# Patient Record
Sex: Female | Born: 1960 | Race: White | Hispanic: No | Marital: Married | Smoking: Never smoker
Health system: Southern US, Community
[De-identification: ages and names within clinical notes are randomized; demographics above are authoritative.]

## PROBLEM LIST (undated history)

## (undated) DIAGNOSIS — K859 Acute pancreatitis without necrosis or infection, unspecified: Secondary | ICD-10-CM

## (undated) DIAGNOSIS — K5790 Diverticulosis of intestine, part unspecified, without perforation or abscess without bleeding: Secondary | ICD-10-CM

## (undated) DIAGNOSIS — K219 Gastro-esophageal reflux disease without esophagitis: Secondary | ICD-10-CM

## (undated) DIAGNOSIS — N83209 Unspecified ovarian cyst, unspecified side: Secondary | ICD-10-CM

## (undated) DIAGNOSIS — G43909 Migraine, unspecified, not intractable, without status migrainosus: Secondary | ICD-10-CM

## (undated) DIAGNOSIS — K3184 Gastroparesis: Secondary | ICD-10-CM

## (undated) DIAGNOSIS — M1990 Unspecified osteoarthritis, unspecified site: Secondary | ICD-10-CM

## (undated) DIAGNOSIS — I499 Cardiac arrhythmia, unspecified: Secondary | ICD-10-CM

## (undated) DIAGNOSIS — R42 Dizziness and giddiness: Secondary | ICD-10-CM

## (undated) HISTORY — PX: MEDIAL COLLATERAL LIGAMENT REPAIR, KNEE: SHX2019

## (undated) HISTORY — PX: ANTERIOR CRUCIATE LIGAMENT REPAIR: SHX115

## (undated) HISTORY — PX: LUMBAR DISC SURGERY: SHX700

## (undated) HISTORY — PX: ACHILLES TENDON REPAIR: SUR1153

---

## 2015-10-27 DIAGNOSIS — K859 Acute pancreatitis without necrosis or infection, unspecified: Secondary | ICD-10-CM

## 2015-10-27 HISTORY — DX: Acute pancreatitis without necrosis or infection, unspecified: K85.90

## 2016-05-02 ENCOUNTER — Emergency Department: Payer: PRIVATE HEALTH INSURANCE

## 2016-05-02 ENCOUNTER — Emergency Department
Admission: EM | Admit: 2016-05-02 | Discharge: 2016-05-02 | Disposition: A | Discharge status: Home / Self Care | Payer: PRIVATE HEALTH INSURANCE | Attending: Emergency Medicine | Admitting: Emergency Medicine

## 2016-05-02 ENCOUNTER — Encounter: Payer: Self-pay | Admitting: Emergency Medicine

## 2016-05-02 DIAGNOSIS — R1031 Right lower quadrant pain: Secondary | ICD-10-CM | POA: Diagnosis present

## 2016-05-02 DIAGNOSIS — K5732 Diverticulitis of large intestine without perforation or abscess without bleeding: Secondary | ICD-10-CM | POA: Insufficient documentation

## 2016-05-02 HISTORY — DX: Gastroparesis: K31.84

## 2016-05-02 HISTORY — DX: Diverticulosis of intestine, part unspecified, without perforation or abscess without bleeding: K57.90

## 2016-05-02 HISTORY — DX: Acute pancreatitis without necrosis or infection, unspecified: K85.90

## 2016-05-02 HISTORY — DX: Unspecified ovarian cyst, unspecified side: N83.209

## 2016-05-02 LAB — COMPREHENSIVE METABOLIC PANEL
ALBUMIN: 4.2 g/dL (ref 3.5–5.0)
ALT: 22 U/L (ref 14–54)
ANION GAP: 8 (ref 5–15)
AST: 23 U/L (ref 15–41)
Alkaline Phosphatase: 77 U/L (ref 38–126)
BUN: 25 mg/dL — ABNORMAL HIGH (ref 6–20)
CALCIUM: 9 mg/dL (ref 8.9–10.3)
CHLORIDE: 106 mmol/L (ref 101–111)
CO2: 24 mmol/L (ref 22–32)
Creatinine, Ser: 0.88 mg/dL (ref 0.44–1.00)
GFR calc non Af Amer: >60 mL/min (ref >60)
GLUCOSE: 123 mg/dL — AB (ref 65–99)
Potassium: 3.7 mmol/L (ref 3.5–5.1)
SODIUM: 138 mmol/L (ref 135–145)
Total Bilirubin: 0.9 mg/dL (ref 0.3–1.2)
Total Protein: 8 g/dL (ref 6.5–8.1)

## 2016-05-02 LAB — URINALYSIS, COMPLETE (UACMP) WITH MICROSCOPIC
BACTERIA UA: NONE SEEN
Bilirubin Urine: NEGATIVE
Glucose, UA: NEGATIVE mg/dL
Hgb urine dipstick: NEGATIVE
KETONES UR: NEGATIVE mg/dL
Leukocytes, UA: NEGATIVE
Nitrite: NEGATIVE
Protein, ur: NEGATIVE mg/dL
RBC / HPF: NONE SEEN RBC/hpf (ref 0–5)
SQUAMOUS EPITHELIAL / LPF: NONE SEEN
Specific Gravity, Urine: 1.034 — ABNORMAL HIGH (ref 1.005–1.030)
WBC, UA: NONE SEEN WBC/hpf (ref 0–5)
pH: 5 (ref 5.0–8.0)

## 2016-05-02 LAB — MAGNESIUM: MAGNESIUM: 1.8 mg/dL (ref 1.7–2.4)

## 2016-05-02 LAB — CBC
HEMATOCRIT: 45.6 % (ref 35.0–47.0)
HEMOGLOBIN: 15.5 g/dL (ref 12.0–16.0)
MCH: 29.2 pg (ref 26.0–34.0)
MCHC: 34 g/dL (ref 32.0–36.0)
MCV: 85.6 fL (ref 80.0–100.0)
Platelets: 264 10*3/uL (ref 150–440)
RBC: 5.32 MIL/uL — AB (ref 3.80–5.20)
RDW: 13.8 % (ref 11.5–14.5)
WBC: 9.7 10*3/uL (ref 3.6–11.0)

## 2016-05-02 LAB — LIPASE, BLOOD: LIPASE: 19 U/L (ref 11–51)

## 2016-05-02 MED ORDER — IOPAMIDOL (ISOVUE-300) INJECTION 61%
30.0000 mL | Freq: Once | INTRAVENOUS | Status: AC | PRN
  Administered 2016-05-02: 30 mL via ORAL

## 2016-05-02 MED ORDER — IOPAMIDOL (ISOVUE-300) INJECTION 61%
100.0000 mL | Freq: Once | INTRAVENOUS | Status: AC | PRN
  Administered 2016-05-02: 100 mL via INTRAVENOUS

## 2016-05-02 MED ORDER — OXYCODONE-ACETAMINOPHEN 5-325 MG PO TABS
2.0000 | ORAL_TABLET | Freq: Once | ORAL | Status: AC
  Administered 2016-05-02: 2 via ORAL
  Filled 2016-05-02: qty 2

## 2016-05-02 MED ORDER — DOCUSATE SODIUM 100 MG PO CAPS: ORAL_CAPSULE | ORAL | 0 refills | Status: DC

## 2016-05-02 MED ORDER — MORPHINE SULFATE (PF) 4 MG/ML IV SOLN
4.0000 mg | Freq: Once | INTRAVENOUS | Status: AC
  Administered 2016-05-02: 4 mg via INTRAVENOUS
  Filled 2016-05-02: qty 1

## 2016-05-02 MED ORDER — ONDANSETRON HCL 4 MG/2ML IJ SOLN
4.0000 mg | INTRAMUSCULAR | Status: AC
  Administered 2016-05-02: 4 mg via INTRAVENOUS
  Filled 2016-05-02: qty 2

## 2016-05-02 MED ORDER — AMOXICILLIN-POT CLAVULANATE 875-125 MG PO TABS: 1.0000 | ORAL_TABLET | Freq: Two times a day (BID) | ORAL | 0 refills | Status: AC

## 2016-05-02 MED ORDER — OXYCODONE-ACETAMINOPHEN 5-325 MG PO TABS: 1.0000 | ORAL_TABLET | ORAL | 0 refills | Status: DC | PRN

## 2016-05-02 MED ORDER — AMOXICILLIN-POT CLAVULANATE 875-125 MG PO TABS
1.0000 | ORAL_TABLET | Freq: Once | ORAL | Status: AC
  Administered 2016-05-02: 1 via ORAL
  Filled 2016-05-02: qty 1

## 2016-05-02 MED ORDER — SODIUM CHLORIDE 0.9 % IV BOLUS (SEPSIS)
500.0000 mL | INTRAVENOUS | Status: AC
  Administered 2016-05-02: 500 mL via INTRAVENOUS

## 2016-05-02 MED ORDER — ONDANSETRON 4 MG PO TBDP: ORAL_TABLET | ORAL | 0 refills | Status: DC

## 2016-05-02 NOTE — ED Provider Notes (Signed)
[facility] Emergency Department Provider Note  ____________________________________________   First MD Initiated Contact with Patient 05/02/16 0140     (approximate)  I have reviewed the triage vital signs and the nursing notes.   HISTORY  Chief Complaint Fever and Abdominal Pain    HPI Susan Strong is a 56 y.o. 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  Presents with acute onset and gradually worsening abdominal pain that started this morninge states that it was mild at first but is now severe.  It is accompanied with persistent nausea and vomiting.  It feels similar to prior episodes of gastroparesis but she is concerned because it is located both in her epigastrium and in the right lower quadrant.  the pain in both locations is sharp and stabbing and made worse with movement.  nothing in particular makes it better.  She had some loose stool earlier tonight.  She denies shortness of breath, chest pain, dysuria.  she has had neither a cholecystectomy nor an appendectomy.  she states that she was extremely ill about 6 months ago when she lived in [place] with necrotizing pancreatitis.  They were not able to identify the cause.  She does not drink alcohol.  She does not have diabetes.  She has not recently started on any new medications.   Past Medical History:  Diagnosis Date  . Diverticulosis   . Gastroparesis   . Ovarian cyst   . Pancreatitis     There are no active problems to display for this patient.   Past Surgical History:  Procedure Laterality Date  . ACHILLES TENDON REPAIR    . ANTERIOR CRUCIATE LIGAMENT REPAIR Right   . LUMBAR DISC SURGERY    . MEDIAL COLLATERAL LIGAMENT REPAIR, KNEE Left     Prior to Admission medications   Medication Sig Start Date End Date Taking? Authorizing Provider    amoxicillin-clavulanate (AUGMENTIN) 875-125 MG tablet Take 1 tablet by mouth every 12 (twelve) hours. 05/02/16 05/12/16  Hinda Kehr, MD  docusate sodium (COLACE) 100 MG capsule Take 1 tablet once or twice daily as needed for constipation while taking narcotic pain medicine 05/02/16   Hinda Kehr, MD  ondansetron (ZOFRAN ODT) 4 MG disintegrating tablet Allow 1-2 tablets to dissolve in your mouth every 8 hours as needed for nausea/vomiting 05/02/16   Hinda Kehr, MD  oxyCODONE-acetaminophen (ROXICET) 5-325 MG tablet Take 1-2 tablets by mouth every 4 (four) hours as needed for severe pain. 05/02/16   Hinda Kehr, MD    Allergies Patient has no known allergies.  History reviewed. No pertinent family history.  Social History Social History  Substance Use Topics  . Smoking status: Never Smoker  . Smokeless tobacco: Never Used  . Alcohol use No    Review of Systems Constitutional: No fever/chills Eyes: No visual changes. ENT: No sore throat. Cardiovascular: Denies chest pain. Respiratory: Denies shortness of breath. Gastrointestinal: Epigastric and right lower quadrant pain with nausea and vomiting starting earlier today Genitourinary: Negative for dysuria. Musculoskeletal: Negative for back pain. Skin: Negative for rash. Neurological: Negative for headaches, focal weakness or numbness.  10-point ROS otherwise negative.  ____________________________________________   PHYSICAL EXAM:  VITAL SIGNS: ED Triage Vitals [05/02/16 0134]  Enc Vitals Group     BP 136/88     Pulse Rate (!) 107     Resp 18     Temp 98.9 F (37.2  C)     Temp Source Oral     SpO2 94 %     Weight 160 lb (72.6 kg)     Height 5\' 6"  (1.676 m)     Head Circumference      Peak Flow      Pain Score 6     Pain Loc      Pain Edu?      Excl. in [place]?     Constitutional: Alert and oriented. nontoxic and generally well-appearing but does appear uncomfortable Eyes: Conjunctivae are normal. PERRL. EOMI. Head:  Atraumatic. Nose: No congestion/rhinnorhea. Mouth/Throat: Mucous membranes are moist. Neck: No stridor.  No meningeal signs.   Cardiovascular: Normal rate, regular rhythm. Good peripheral circulation. Grossly normal heart sounds. Respiratory: Normal respiratory effort.  No retractions. Lungs CTAB. Gastrointestinal: Soft with moderate tenderness to palpation of the epigastrium and RLQ with mild rebound.  No guarding.  Negative Murphy's sign. Musculoskeletal: No lower extremity tenderness nor edema. No gross deformities of extremities. Neurologic:  Normal speech and language. No gross focal neurologic deficits are appreciated.  Skin:  Skin is warm, dry and intact. No rash noted. Psychiatric: Mood and affect are anxious. Speech and behavior are normal.  ____________________________________________   LABS (all labs ordered are listed, but only abnormal results are displayed)  Labs Reviewed  COMPREHENSIVE METABOLIC PANEL - Abnormal; Notable for the following:       Result Value   Glucose, Bld 123 (*)    BUN 25 (*)    All other components within normal limits  CBC - Abnormal; Notable for the following:    RBC 5.32 (*)    All other components within normal limits  URINALYSIS, COMPLETE (UACMP) WITH MICROSCOPIC - Abnormal; Notable for the following:    Color, Urine STRAW (*)    APPearance CLEAR (*)    Specific Gravity, Urine 1.034 (*)    All other components within normal limits  LIPASE, BLOOD  MAGNESIUM   ____________________________________________  EKG  None - EKG not ordered by ED physician ____________________________________________  RADIOLOGY   Ct Abdomen Pelvis W Contrast  Result Date: 05/02/2016 CLINICAL DATA:  Low-grade fevers with right lower quadrant pain. History of pancreatitis, last hospitalized in October. EXAM: CT ABDOMEN AND PELVIS WITH CONTRAST TECHNIQUE: Multidetector CT imaging of the abdomen and pelvis was performed using the standard protocol following bolus  administration of intravenous contrast. CONTRAST:  120mL ISOVUE-300 IOPAMIDOL (ISOVUE-300) INJECTION 61% COMPARISON:  None. FINDINGS: Lower chest: Normal size cardiac chambers without pericardial effusion. Atelectasis and/or scarring at the right lung base. No dominant mass, effusion or pneumothorax. Hepatobiliary: Variable sized circumscribed hypodensities consistent with cysts or hemangiomata of the liver ranging in size from 2 mm through 22 mm. Some are too small to further characterize. Most demonstrate Hounsfield units suggestive of simple cysts. The gallbladder is physiologically distended without calculus. No wall thickening nor pericholecystic fluid. No biliary dilatation is identified. Pancreas: Hypodensities noted along the body and tail of the pancreas are noted which may reflect sequela of chronic pancreatitis and pseudocysts. Side branch IPMN of the pancreas are not entirely excluded. The largest area of hypodensity measures 3.7 cm in the body. Smaller hypodensities along the pancreatic tail measuring to 22 mm are noted as well as a tubular hypodensity along the caudal surface the pancreas on the coronal reformats. No ductal dilatation. Spleen: Normal in size without focal abnormality. Adrenals/Urinary Tract: Normal bilateral adrenal glands. Small parapelvic renal cysts are noted. Small hypodensities are noted within the mid  to lower pole of the right kidney consistent with cysts, the largest approximately 7 mm. These are too small to further characterize. Minimal cortical scarring is seen posteriorly involving the interpolar right kidney. Stomach/Bowel: The stomach is distended with oral contrast. There is normal small bowel rotation without evidence of obstruction or definite inflammation. The appendix is normal. Segmental thickening along the proximal sigmoid colon with pericolonic inflammation is identified. Surrounding diverticulosis is noted. Findings likely represent acute diverticulitis however  followup to assure resolution is recommended to exclude neoplasm after appropriate therapy. Vascular/Lymphatic: No significant vascular findings are present. No enlarged abdominal or pelvic lymph nodes. Reproductive: Uterus and bilateral adnexa are unremarkable. Other: No abdominal wall hernia or abnormality. Trace free fluid in the pelvis. Musculoskeletal: Degenerative disc or scar high across cough or small right central disc-osteophyte complex. IMPRESSION: 1. Abnormal thickening with pericolonic inflammation involving a short segment of proximal sigmoid colon. Surrounding areas of diverticulosis are noted. Findings likely represent acute diverticulitis with pericolonic inflammation and trace adjacent free fluid. No abscess is identified. No free air is noted. Follow-up is recommended to assure resolution and to exclude neoplasm. 2. The variable sized hepatic hypodensities ranging in size from 2 mm to 22 mm statistically consistent with cysts or hemangiomata. 3. Cystic hypodensities associated with the pancreas or likely representing areas of pseudocyst formation given history of prior pancreatitis. Side-branch IPMN are possibilities. These too can be reassessed up on follow-up or evaluated with dedicated pancreas CT or MRI. 4. Small subcentimeter hypodensities associated with the mid to lower right kidney also likely to represent small cyst but too small to further characterize. Electronically Signed   By: Ashley Royalty M.D.   On: 05/02/2016 03:11    ____________________________________________   PROCEDURES  Critical Care performed: No   Procedure(s) performed:   Procedures   ____________________________________________   INITIAL IMPRESSION / ASSESSMENT AND PLAN / ED COURSE  Pertinent labs & imaging results that were available during my care of the patient were reviewed by me and considered in my medical decision making (see chart for details).  the patient has a complicated history of  idiopathic pancreatitis as well as recurrent episodes of gastroparesis. her tenderness to the epigastrium and right lower quadrant could suggest either pancreatitis, gallbladder disease,appendicitis, or gastroparesis.  We will evaluate broadly including CT scan.  Analgesia and antiemetics.  Small fluid bolus.  Patient agrees with the plan.   Clinical Course as of May 03 346  Sat May 02, 2016  [id number] I reviewed the patient's prescription history over the last 12 months in the multi-state controlled substances database(s) that includes [place], [place], [place], [place], [place], [place], [place], [place], [place], [place], [place], [place], [place], and [place].  The patient has filled no controlled substances during that time.   [CF]  [phone] The patient has a reassuring lab workup.  Her CT scan report indicates uncomplicated diverticulitis.  I discussed this with her and she is comfortable with the plan for outpatient treatment.  She has had bilateral Achilles tendon surgeries and would prefer to avoid Cipro, so I will prescribe Augmentin as well as Zofran and Percocet.  I gave my usual and customary return precautions.     [CF]  [id number] I also provided follow up contact information for the patient navigator to establish a PCP (although she plans to see Dr. Doy Mince), gastroenterology (Dr. Vicente Males), and surgery PRN (Dr. Hampton Abbot).  [CF]    Clinical Course User Index [CF] Hinda Kehr, MD  ____________________________________________  FINAL CLINICAL IMPRESSION(S) / ED DIAGNOSES  Final diagnoses:  Diverticulitis of large intestine without perforation or abscess, unspecified bleeding status     MEDICATIONS GIVEN DURING THIS VISIT:  Medications  amoxicillin-clavulanate (AUGMENTIN) 875-125 MG per tablet 1 tablet   oxyCODONE-acetaminophen (PERCOCET/ROXICET) 5-325 MG per tablet 2 tablet   morphine 4 MG/ML injection 4 mg (4 mg Intravenous Given 05/02/16 0157)    ondansetron (ZOFRAN) injection 4 mg (4 mg Intravenous Given 05/02/16 0157)  sodium chloride 0.9 % bolus 500 mL (0 mLs Intravenous Stopped 05/02/16 0230)  iopamidol (ISOVUE-300) 61 % injection 30 mL (30 mLs Oral Contrast Given 05/02/16 0207)  iopamidol (ISOVUE-300) 61 % injection 100 mL (100 mLs Intravenous Contrast Given 05/02/16 0234)     NEW OUTPATIENT MEDICATIONS STARTED DURING THIS VISIT:  New Prescriptions   AMOXICILLIN-CLAVULANATE (AUGMENTIN) 875-125 MG TABLET    Take 1 tablet by mouth every 12 (twelve) hours.   DOCUSATE SODIUM (COLACE) 100 MG CAPSULE    Take 1 tablet once or twice daily as needed for constipation while taking narcotic pain medicine   ONDANSETRON (ZOFRAN ODT) 4 MG DISINTEGRATING TABLET    Allow 1-2 tablets to dissolve in your mouth every 8 hours as needed for nausea/vomiting   OXYCODONE-ACETAMINOPHEN (ROXICET) 5-325 MG TABLET    Take 1-2 tablets by mouth every 4 (four) hours as needed for severe pain.    Modified Medications   No medications on file    Discontinued Medications   No medications on file     Note:  This document was prepared using Dragon voice recognition software and may include unintentional dictation errors.    Hinda Kehr, MD 05/02/16 [phone]

## 2016-05-02 NOTE — ED Notes (Signed)
Pt returned from CT °

## 2016-05-02 NOTE — ED Notes (Signed)

## 2016-05-02 NOTE — ED Triage Notes (Signed)
Pt ambulatory to triage in NAD, reports RLQ and low grade fever at home, report hx of pancreatitis, last hospitalized for it back in October.  Reports nausea w/o vomiting

## 2016-05-02 NOTE — ED Notes (Signed)
Pt finished contrast; CT called; Pam will scan once CMP resulted

## 2016-05-02 NOTE — ED Notes (Signed)
Pt going to CT

## 2016-05-02 NOTE — Discharge Instructions (Signed)
We believe your symptoms are caused by diverticulitis.  Most of the time this condition (please read through the included information) can be cured with outpatient antibiotics.  Please take the full course of prescribed medication(s) and follow up with the doctors recommended above.  Return to the ED if your abdominal pain worsens or fails to improve, you develop bloody vomiting, bloody diarrhea, you are unable to tolerate fluids due to vomiting, fever greater than 101, or other symptoms that concern you.  Take Percocet as prescribed for severe pain. Do not drink alcohol, drive or participate in any other potentially dangerous activities while taking this medication as it may make you sleepy. Do not take this medication with any other sedating medications, either prescription or over-the-counter. If you were prescribed Percocet or Vicodin, do not take these with acetaminophen (Tylenol) as it is already contained within these medications.   This medication is an opiate (or narcotic) pain medication and can be habit forming.  Use it as little as possible to achieve adequate pain control.  Do not use or use it with extreme caution if you have a history of opiate abuse or dependence.  If you are on a pain contract with your primary care doctor or a pain specialist, be sure to let them know you were prescribed this medication today from the [facility].  This medication is intended for your use only - do not give any to anyone else and keep it in a secure place where nobody else, especially children, have access to it.  It will also cause or worsen constipation, so you may want to consider taking an over-the-counter stool softener while you are taking this medication.  

## 2016-05-02 NOTE — ED Notes (Signed)
Pt drinking second bottle of contrast 

## 2016-05-19 DIAGNOSIS — Z7189 Other specified counseling: Secondary | ICD-10-CM | POA: Insufficient documentation

## 2016-05-19 DIAGNOSIS — K5730 Diverticulosis of large intestine without perforation or abscess without bleeding: Secondary | ICD-10-CM | POA: Insufficient documentation

## 2016-05-19 DIAGNOSIS — Z7185 Encounter for immunization safety counseling: Secondary | ICD-10-CM | POA: Insufficient documentation

## 2016-05-20 DIAGNOSIS — E7800 Pure hypercholesterolemia, unspecified: Secondary | ICD-10-CM | POA: Insufficient documentation

## 2016-06-09 ENCOUNTER — Encounter: Payer: Self-pay | Admitting: Gastroenterology

## 2016-06-09 ENCOUNTER — Ambulatory Visit (INDEPENDENT_AMBULATORY_CARE_PROVIDER_SITE_OTHER): Payer: PRIVATE HEALTH INSURANCE | Admitting: Gastroenterology

## 2016-06-09 VITALS — BP 111/75 | HR 60 | Temp 97.4°F | Resp 16 | Ht 66.0 in | Wt 161.6 lb

## 2016-06-09 DIAGNOSIS — K5732 Diverticulitis of large intestine without perforation or abscess without bleeding: Secondary | ICD-10-CM | POA: Diagnosis not present

## 2016-06-09 DIAGNOSIS — K859 Acute pancreatitis without necrosis or infection, unspecified: Secondary | ICD-10-CM | POA: Diagnosis not present

## 2016-06-09 NOTE — Progress Notes (Signed)
Gastroenterology Consultation  Referring Provider:     Dr Karma Greaser Primary Care Physician:  Patient, No Pcp Per Primary Gastroenterologist:  Dr. Jonathon Bellows  Reason for Consultation:     Diverticulitis and pancreatitis.         HPI:   Susan Strong is a 56 y.o. y/o female female referred for consultation & management  by Dr Karma Greaser  She has been referred for pancreatitis and diverticulitis. She was seen at the ER on 05/02/16 and per ED note has  ahistory of necrotizing pancreatitis in 01/2013  at [place] of unknown etiology   , gastroparesis , she presented to the ER with abdominal pain . She had a CT scan in the ER which showed pericolonic inflammaiton , acute diverticulitis , heptic hypodensities which could be cysts or hemangiomata. Cystic hypodensities ?pseudocysts , possible side branch IPMN. Subcenter hypodensities of the kidney. No gall bladder calculus. Recent labs including CMP,CBC,Lipase were normal.    She has brought in records from [place] and a MRI -severe acute pancreatitis (necrotizing pancreatitis fat necrosis) , no gall stones or common bile duct stones. No mention of pancreatic divisum in report. USG abdomen - normal . She was in the ICU for 18 days, resolved on its own . She was told she had a pseudocyst . I reviewed her labs during the admission , right after her pancreatitis her transaminases were normal , after she started TPM they were a bit elevated. An ultrasound at that time suggests she had some sludge . IGG4 levels were normal.  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 She says there is no etiology been found for her gastroparesis  She says she has been to the hospital recently for abdominal pain   Abdominal pain: Onset: since pancreatitis towards the end  Site :all over the abdomen - when she eats the "wrong thing"overall it is improving , trying to lose some weight  Radiation: sometimes goes to the back  [place] of  pain: like indigestion, associated with burping  Aggravating factors: eating  Relieving factors :not eating  Weight loss: stable  NSAID use: no  PPI use :only rarely tigamet  Gall bladder surgery: intact  Frequency of bowel movements: everyday , when she had diverticulitis had 8 BM's a day  Change in bowel movements: none presently  Relief with bowel movements: no  Gas/Bloating/Abdominal distension: no    She had 2 episodes of diverticulitis twice, once a month back and again a week back and is completing a second course.   Grandmother and parents had colon polyps, she too has had colon polyps, her last colonoscopy was 5 years back .    Not on any medications, herbal supplements.    Past Medical History:  Diagnosis Date  . Diverticulosis   . Gastroparesis   . Ovarian cyst   . Pancreatitis     Past Surgical History:  Procedure Laterality Date  . ACHILLES TENDON REPAIR    . ANTERIOR CRUCIATE LIGAMENT REPAIR Right   . LUMBAR DISC SURGERY    . MEDIAL COLLATERAL LIGAMENT REPAIR, KNEE Left     Prior to Admission medications   Medication Sig Start Date End Date Taking? Authorizing Provider  docusate sodium (COLACE) 100 MG capsule Take 1 tablet once or twice daily as needed for constipation while taking narcotic pain medicine Patient not taking: Reported on 06/09/2016 05/02/16   Hinda Kehr, MD  ondansetron (ZOFRAN ODT) 4 MG disintegrating tablet  Allow 1-2 tablets to dissolve in your mouth every 8 hours as needed for nausea/vomiting Patient not taking: Reported on 06/09/2016 05/02/16   Hinda Kehr, MD  oxyCODONE-acetaminophen (ROXICET) 5-325 MG tablet Take 1-2 tablets by mouth every 4 (four) hours as needed for severe pain. Patient not taking: Reported on 06/09/2016 05/02/16   Hinda Kehr, MD    No family history on file.   Social History  Substance Use Topics  . Smoking status: Never Smoker  . Smokeless tobacco: Never Used  . Alcohol use No    Allergies as of 06/09/2016    . (No Known Allergies)    Review of Systems:    All systems reviewed and negative except where noted in HPI.   Physical Exam:  BP 111/75   Pulse 60   Temp 97.4 F (36.3 C) (Oral)   Resp 16   Ht 5\' 6"  (1.676 m)   Wt 161 lb 9.6 oz (73.3 kg)   BMI 26.08 kg/m  No LMP recorded. Patient is perimenopausal. Psych:  Alert and cooperative. Normal mood and affect. General:   Alert,  Well-developed, well-nourished, pleasant and cooperative in NAD Head:  Normocephalic and atraumatic. Eyes:  Sclera clear, no icterus.   Conjunctiva pink. Ears:  Normal auditory acuity. Nose:  No deformity, discharge, or lesions. Mouth:  No deformity or lesions,oropharynx pink & moist. Neck:  Supple; no masses or thyromegaly. Lungs:  Respirations even and unlabored.  Clear throughout to auscultation.   No wheezes, crackles, or rhonchi. No acute distress. Heart:  Regular rate and rhythm; no murmurs, clicks, rubs, or gallops. Abdomen:  Normal bowel sounds.  No bruits.  Soft, non-tender and non-distended without masses, hepatosplenomegaly or hernias noted.  No guarding or rebound tenderness.    Msk:  Symmetrical without gross deformities. Good, equal movement & strength bilaterally. Neurologic:  Alert and oriented x3;  grossly normal neurologically. Skin:  Intact without significant lesions or rashes. No jaundice. Lymph Nodes:  No significant cervical adenopathy. Psych:  Alert and cooperative. Normal mood and affect.  Imaging Studies: No results found.  Assessment and Plan:   Susan Strong is a 56 y.o. y/o female has been referred for recent episode of diverticulitis . 2 episodes recently , H/o colon polyps  Plan  1. MRI liver mass protocol and MRCP to evaluate lesions in the liver, pancreas, r/o IPMN and any other lesions such as divisum that could have triggered the severe episode of pancreatitis. Schedule in 4-5 weeks not sooner as we need to wait for the diverticulitis to resolve in terms of the  inflammation   2. Colonoscopy after imaging once colon inflammation resolution has been confirmed.    Follow up in 3 months   Dr Jonathon Bellows MD

## 2016-06-10 ENCOUNTER — Other Ambulatory Visit: Payer: Self-pay

## 2016-06-10 DIAGNOSIS — K5730 Diverticulosis of large intestine without perforation or abscess without bleeding: Secondary | ICD-10-CM

## 2016-06-24 ENCOUNTER — Ambulatory Visit
Admission: RE | Admit: 2016-06-24 | Discharge: 2016-06-24 | Disposition: A | Discharge status: Home / Self Care | Payer: PRIVATE HEALTH INSURANCE | Source: Ambulatory Visit | Attending: Gastroenterology | Admitting: Gastroenterology

## 2016-06-24 ENCOUNTER — Other Ambulatory Visit: Payer: Self-pay | Admitting: Gastroenterology

## 2016-06-24 DIAGNOSIS — K5730 Diverticulosis of large intestine without perforation or abscess without bleeding: Secondary | ICD-10-CM | POA: Diagnosis present

## 2016-06-24 DIAGNOSIS — K7689 Other specified diseases of liver: Secondary | ICD-10-CM | POA: Insufficient documentation

## 2016-06-24 DIAGNOSIS — N281 Cyst of kidney, acquired: Secondary | ICD-10-CM | POA: Insufficient documentation

## 2016-06-24 MED ORDER — GADOBENATE DIMEGLUMINE 529 MG/ML IV SOLN
15.0000 mL | Freq: Once | INTRAVENOUS | Status: AC | PRN
  Administered 2016-06-24: 15 mL via INTRAVENOUS

## 2016-06-26 ENCOUNTER — Telehealth: Payer: Self-pay | Admitting: Gastroenterology

## 2016-06-26 NOTE — Telephone Encounter (Signed)
results

## 2016-06-29 ENCOUNTER — Telehealth: Payer: Self-pay

## 2016-06-29 NOTE — Telephone Encounter (Signed)
LVM for patient advising that results were in from scans but we're unable to relay any information due to Dr. Vicente Males not having read the results yet.  Advised a callback on Thursday with the information.

## 2016-07-03 ENCOUNTER — Telehealth: Payer: Self-pay | Admitting: Gastroenterology

## 2016-07-03 NOTE — Telephone Encounter (Signed)
Patient called wanting her results of her MRI.

## 2016-07-05 NOTE — Telephone Encounter (Signed)
seee MRI report and my note

## 2016-07-06 ENCOUNTER — Other Ambulatory Visit: Payer: Self-pay

## 2016-07-06 DIAGNOSIS — Z1211 Encounter for screening for malignant neoplasm of colon: Secondary | ICD-10-CM

## 2016-07-06 DIAGNOSIS — Z1212 Encounter for screening for malignant neoplasm of rectum: Principal | ICD-10-CM

## 2016-07-06 NOTE — Telephone Encounter (Signed)
  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 Ok to schedule colonoscopy as per my last office note  Gastroenterology Pre-Procedure Review  Request Date: 6/21 Requesting Physician: Dr. Allen Norris  PATIENT REVIEW QUESTIONS: The patient responded to the following health history questions as indicated:    1. Are you having any GI issues? GERD 2. Do you have a personal history of Polyps? yes (removed) 3. Do you have a family history of Colon Cancer or Polyps? yes (maternal gm: deceased via colon cancer) 4. Diabetes Mellitus? no 5. Joint replacements in the past 12 months?no 6. Major health problems in the past 3 months?yes (pancreatitis) 7. Any artificial heart valves, MVP, or defibrillator?no    MEDICATIONS & ALLERGIES:    Patient reports the following regarding taking any anticoagulation/antiplatelet therapy:   Plavix, Coumadin, Eliquis, Xarelto, Lovenox, Pradaxa, Brilinta, or Effient? no Aspirin? no  Patient confirms/reports the following medications:  Current Outpatient Prescriptions  Medication Sig Dispense Refill  . docusate sodium (COLACE) 100 MG capsule Take 1 tablet once or twice daily as needed for constipation while taking narcotic pain medicine (Patient not taking: Reported on 06/09/2016) 30 capsule 0  . ondansetron (ZOFRAN ODT) 4 MG disintegrating tablet Allow 1-2 tablets to dissolve in your mouth every 8 hours as needed for nausea/vomiting (Patient not taking: Reported on 06/09/2016) 30 tablet 0  . oxyCODONE-acetaminophen (ROXICET) 5-325 MG tablet Take 1-2 tablets by mouth every 4 (four) hours as needed for severe pain. (Patient not taking: Reported on 06/09/2016) 30 tablet 0   No current facility-administered medications for this visit.     Patient confirms/reports the following allergies:  No Known Allergies  No orders of the defined types were placed in this encounter.   AUTHORIZATION INFORMATION Primary Insurance: 1D#: Group #:  Secondary Insurance: 1D#: Group  #:  SCHEDULE INFORMATION: Date: 6/21 Time: Location: [place]

## 2016-07-07 ENCOUNTER — Telehealth: Payer: Self-pay | Admitting: Gastroenterology

## 2016-07-07 NOTE — Telephone Encounter (Signed)
07/07/16 Spoke with Rise Paganini at [facility] for Colonoscopy [phone] / Z12.11 has been approved Ref #: [id number].

## 2016-07-08 ENCOUNTER — Encounter: Payer: Self-pay | Admitting: *Deleted

## 2016-07-15 NOTE — Discharge Instructions (Signed)
General Anesthesia, Adult, Care After °These instructions provide you with information about caring for yourself after your procedure. Your health care provider may also give you more specific instructions. Your treatment has been planned according to current medical practices, but problems sometimes occur. Call your health care provider if you have any problems or questions after your procedure. °What can I expect after the procedure? °After the procedure, it is common to have: °· Vomiting. °· A sore throat. °· Mental slowness. ° °It is common to feel: °· Nauseous. °· Cold or shivery. °· Sleepy. °· Tired. °· Sore or achy, even in parts of your body where you did not have surgery. ° °Follow these instructions at home: °For at least 24 hours after the procedure: °· Do not: °? Participate in activities where you could fall or become injured. °? Drive. °? Use heavy machinery. °? Drink alcohol. °? Take sleeping pills or medicines that cause drowsiness. °? Make important decisions or sign legal documents. °? Take care of children on your own. °· Rest. °Eating and drinking °· If you vomit, drink water, juice, or soup when you can drink without vomiting. °· Drink enough fluid to keep your urine clear or pale yellow. °· Make sure you have little or no nausea before eating solid foods. °· Follow the diet recommended by your health care provider. °General instructions °· Have a responsible adult stay with you until you are awake and alert. °· Return to your normal activities as told by your health care provider. Ask your health care provider what activities are safe for you. °· Take over-the-counter and prescription medicines only as told by your health care provider. °· If you smoke, do not smoke without supervision. °· Keep all follow-up visits as told by your health care provider. This is important. °Contact a health care provider if: °· You continue to have nausea or vomiting at home, and medicines are not helpful. °· You  cannot drink fluids or start eating again. °· You cannot urinate after 8-12 hours. °· You develop a skin rash. °· You have fever. °· You have increasing redness at the site of your procedure. °Get help right away if: °· You have difficulty breathing. °· You have chest pain. °· You have unexpected bleeding. °· You feel that you are having a life-threatening or urgent problem. °This information is not intended to replace advice given to you by your health care provider. Make sure you discuss any questions you have with your health care provider. °Document Released: 04/20/2000 Document Revised: 06/17/2015 Document Reviewed: 12/27/2014 °Elsevier Interactive Patient Education © 2018 [employer] Inc[employer] ° °

## 2016-07-16 ENCOUNTER — Ambulatory Visit: Payer: PRIVATE HEALTH INSURANCE | Admitting: Anesthesiology

## 2016-07-16 ENCOUNTER — Encounter
Admission: RE | Disposition: A | Discharge status: Home / Self Care | Payer: Self-pay | Source: Ambulatory Visit | Attending: Gastroenterology

## 2016-07-16 ENCOUNTER — Ambulatory Visit
Admission: RE | Admit: 2016-07-16 | Discharge: 2016-07-16 | Disposition: A | Discharge status: Home / Self Care | Payer: PRIVATE HEALTH INSURANCE | Source: Ambulatory Visit | Attending: Gastroenterology | Admitting: Gastroenterology

## 2016-07-16 DIAGNOSIS — Z1211 Encounter for screening for malignant neoplasm of colon: Secondary | ICD-10-CM | POA: Insufficient documentation

## 2016-07-16 DIAGNOSIS — K5730 Diverticulosis of large intestine without perforation or abscess without bleeding: Secondary | ICD-10-CM | POA: Diagnosis not present

## 2016-07-16 DIAGNOSIS — Z860100 Personal history of colon polyps, unspecified: Secondary | ICD-10-CM

## 2016-07-16 DIAGNOSIS — K635 Polyp of colon: Secondary | ICD-10-CM | POA: Diagnosis not present

## 2016-07-16 DIAGNOSIS — D125 Benign neoplasm of sigmoid colon: Secondary | ICD-10-CM

## 2016-07-16 HISTORY — DX: Unspecified osteoarthritis, unspecified site: M19.90

## 2016-07-16 HISTORY — DX: Cardiac arrhythmia, unspecified: I49.9

## 2016-07-16 HISTORY — DX: Dizziness and giddiness: R42

## 2016-07-16 HISTORY — PX: COLONOSCOPY WITH PROPOFOL: SHX5780

## 2016-07-16 HISTORY — DX: Gastro-esophageal reflux disease without esophagitis: K21.9

## 2016-07-16 HISTORY — PX: BIOPSY: SHX5522

## 2016-07-16 HISTORY — DX: Migraine, unspecified, not intractable, without status migrainosus: G43.909

## 2016-07-16 SURGERY — COLONOSCOPY WITH PROPOFOL
Anesthesia: General | Site: Rectum | Wound class: Dirty or Infected

## 2016-07-16 MED ORDER — PROPOFOL 10 MG/ML IV BOLUS
INTRAVENOUS | Status: DC | PRN
  Administered 2016-07-16: 20 mg via INTRAVENOUS
  Administered 2016-07-16: 80 mg via INTRAVENOUS
  Administered 2016-07-16 (×3): 20 mg via INTRAVENOUS

## 2016-07-16 MED ORDER — LIDOCAINE HCL (CARDIAC) 20 MG/ML IV SOLN
INTRAVENOUS | Status: DC | PRN
  Administered 2016-07-16: 40 mg via INTRAVENOUS

## 2016-07-16 MED ORDER — STERILE WATER FOR IRRIGATION IR SOLN
Status: DC | PRN
  Administered 2016-07-16: 11:00:00

## 2016-07-16 MED ORDER — LACTATED RINGERS IV SOLN
INTRAVENOUS | Status: DC | PRN
  Administered 2016-07-16: 11:00:00 via INTRAVENOUS

## 2016-07-16 SURGICAL SUPPLY — 23 items
CANISTER SUCT 1200ML W/VALVE (MISCELLANEOUS) ×3 IMPLANT
CLIP HMST 235XBRD CATH ROT (MISCELLANEOUS) IMPLANT
CLIP RESOLUTION 360 11X235 (MISCELLANEOUS)
FCP ESCP3.2XJMB 240X2.8X (MISCELLANEOUS)
FORCEPS BIOP RAD 4 LRG CAP 4 (CUTTING FORCEPS) IMPLANT
FORCEPS BIOP RJ4 240 W/NDL (MISCELLANEOUS)
FORCEPS ESCP3.2XJMB 240X2.8X (MISCELLANEOUS) IMPLANT
GOWN CVR UNV OPN BCK APRN NK (MISCELLANEOUS) ×2 IMPLANT
GOWN ISOL THUMB LOOP REG UNIV (MISCELLANEOUS) ×4
INJECTOR VARIJECT VIN23 (MISCELLANEOUS) IMPLANT
KIT DEFENDO VALVE AND CONN (KITS) IMPLANT
KIT ENDO PROCEDURE OLY (KITS) ×3 IMPLANT
MARKER SPOT ENDO TATTOO 5ML (MISCELLANEOUS) IMPLANT
PAD GROUND ADULT SPLIT (MISCELLANEOUS) IMPLANT
PROBE APC STR FIRE (PROBE) IMPLANT
RETRIEVER NET ROTH 2.5X230 LF (MISCELLANEOUS) IMPLANT
SNARE SHORT THROW 13M SML OVAL (MISCELLANEOUS) ×3 IMPLANT
SNARE SHORT THROW 30M LRG OVAL (MISCELLANEOUS) IMPLANT
SNARE SNG USE RND 15MM (INSTRUMENTS) IMPLANT
SPOT EX ENDOSCOPIC TATTOO (MISCELLANEOUS)
TRAP ETRAP POLY (MISCELLANEOUS) ×3 IMPLANT
VARIJECT INJECTOR VIN23 (MISCELLANEOUS)
WATER STERILE IRR 250ML POUR (IV SOLUTION) ×3 IMPLANT

## 2016-07-16 NOTE — H&P (Signed)
   Lucilla Lame, MD [address] [place], [place] [address] Phone:[phone] Fax : [phone]  Primary Care Physician:  Dion Body, MD Primary Gastroenterologist:  Dr. Allen Norris  Pre-Procedure History & Physical: HPI:  Susan Strong is a 56 y.o. female is here for an colonoscopy.   Past Medical History:  Diagnosis Date  . Arthritis    knees, back, neck  . Diverticulosis   . Dysrhythmia    "doubles" and "triples".  Asymptomatic  . Gastroparesis   . GERD (gastroesophageal reflux disease)   . Migraine headache    none, over 1 yr  . Ovarian cyst   . Pancreatitis 10/27/2015   Acute Hemorragic Necrotizing Pancreatitis ([place], [place])  . Vertigo    no episodes sveral yrs    Past Surgical History:  Procedure Laterality Date  . ACHILLES TENDON REPAIR    . ANTERIOR CRUCIATE LIGAMENT REPAIR Right   . LUMBAR DISC SURGERY    . MEDIAL COLLATERAL LIGAMENT REPAIR, KNEE Left     Prior to Admission medications   Not on File    Allergies as of 07/06/2016  . (No Known Allergies)    History reviewed. No pertinent family history.  Social History   Social History  . Marital status: Married    Spouse name: N/A  . Number of children: N/A  . Years of education: N/A   Occupational History  . Not on file.   Social History Main Topics  . Smoking status: Never Smoker  . Smokeless tobacco: Never Used  . Alcohol use No  . Drug use: No  . Sexual activity: Not on file   Other Topics Concern  . Not on file   Social History Narrative  . No narrative on file    Review of Systems: See HPI, otherwise negative ROS  Physical Exam: BP 120/83   Pulse 86   Temp 97.5 F (36.4 C) (Temporal)   Ht 5\' 6"  (1.676 m)   Wt 155 lb (70.3 kg)   SpO2 97%   BMI 25.02 kg/m  General:   Alert,  pleasant and cooperative in NAD Head:  Normocephalic and atraumatic. Neck:  Supple; no masses or thyromegaly. Lungs:  Clear throughout to auscultation.    Heart:  Regular  rate and rhythm. Abdomen:  Soft, nontender and nondistended. Normal bowel sounds, without guarding, and without rebound.   Neurologic:  Alert and  oriented x4;  grossly normal neurologically.  Impression/Plan: Susan Strong is here for an colonoscopy to be performed for history of colon polyps  Risks, benefits, limitations, and alternatives regarding  colonoscopy have been reviewed with the patient.  Questions have been answered.  All parties agreeable.   Lucilla Lame, MD  07/16/2016, 11:00 AM

## 2016-07-16 NOTE — Anesthesia Preprocedure Evaluation (Signed)
Anesthesia Evaluation  Patient identified by MRN, date of birth, ID band Patient awake    Reviewed: Allergy & Precautions, H&P , NPO status , Patient's Chart, lab work & pertinent test results  Airway Mallampati: II  TM Distance: >3 FB Neck ROM: full    Dental no notable dental hx.    Pulmonary neg pulmonary ROS,    Pulmonary exam normal        Cardiovascular negative cardio ROS Normal cardiovascular exam     Neuro/Psych    GI/Hepatic Neg liver ROS, Medicated,  Endo/Other  negative endocrine ROS  Renal/GU negative Renal ROS     Musculoskeletal   Abdominal   Peds  Hematology negative hematology ROS (+)   Anesthesia Other Findings   Reproductive/Obstetrics                             Anesthesia Physical Anesthesia Plan  ASA: II  Anesthesia Plan: General   Post-op Pain Management:    Induction:   PONV Risk Score and Plan:   Airway Management Planned:   Additional Equipment:   Intra-op Plan:   Post-operative Plan:   Informed Consent: I have reviewed the patients History and Physical, chart, labs and discussed the procedure including the risks, benefits and alternatives for the proposed anesthesia with the patient or authorized representative who has indicated his/her understanding and acceptance.     Plan Discussed with:   Anesthesia Plan Comments:         Anesthesia Quick Evaluation

## 2016-07-16 NOTE — Anesthesia Postprocedure Evaluation (Signed)
Anesthesia Post Note  Patient: Susan Strong  Procedure(s) Performed: Procedure(s) (LRB): COLONOSCOPY WITH PROPOFOL (N/A) BIOPSY (N/A)  Patient location during evaluation: PACU Anesthesia Type: General Level of consciousness: awake and alert Pain management: pain level controlled Vital Signs Assessment: post-procedure vital signs reviewed and stable Respiratory status: spontaneous breathing Cardiovascular status: blood pressure returned to baseline Postop Assessment: no headache Anesthetic complications: no    Jaci Standard, III,  Naseem Varden D

## 2016-07-16 NOTE — Transfer of Care (Signed)
Immediate Anesthesia Transfer of Care Note  Patient: Susan Strong  Procedure(s) Performed: Procedure(s): COLONOSCOPY WITH PROPOFOL (N/A) BIOPSY (N/A)  Patient Location: PACU  Anesthesia Type: General  Level of Consciousness: awake, alert  and patient cooperative  Airway and Oxygen Therapy: Patient Spontanous Breathing and Patient connected to supplemental oxygen  Post-op Assessment: Post-op Vital signs reviewed, Patient's Cardiovascular Status Stable, Respiratory Function Stable, Patent Airway and No signs of Nausea or vomiting  Post-op Vital Signs: Reviewed and stable  Complications: No apparent anesthesia complications

## 2016-07-16 NOTE — Anesthesia Procedure Notes (Signed)
Performed by: Danamarie Minami Pre-anesthesia Checklist: Patient identified, Emergency Drugs available, Suction available, Timeout performed and Patient being monitored Patient Re-evaluated:Patient Re-evaluated prior to induction Oxygen Delivery Method: Nasal cannula Placement Confirmation: positive ETCO2       

## 2016-07-16 NOTE — Op Note (Signed)
[facility] Gastroenterology Patient Name: Susan Strong Procedure Date: 07/16/2016 11:00 AM MRN: [id number] Account #: [id number] Date of Birth: 23-Jan-1961 Admit Type: Outpatient Age: 56 Room: [facility] OR ROOM 01 Gender: Female Note Status: Finalized Procedure:            Colonoscopy Indications:          High risk colon cancer surveillance: Personal history                        of colonic polyps Providers:            Lucilla Lame MD, MD Referring MD:         Dion Body (Referring MD) Complications:        No immediate complications. Procedure:            Pre-Anesthesia Assessment:                       - Prior to the procedure, a History and Physical was                        performed, and patient medications and allergies were                        reviewed. The patient's tolerance of previous                        anesthesia was also reviewed. The risks and benefits of                        the procedure and the sedation options and risks were                        discussed with the patient. All questions were                        answered, and informed consent was obtained. Prior                        Anticoagulants: The patient has taken no previous                        anticoagulant or antiplatelet agents. ASA Grade                        Assessment: II - A patient with mild systemic disease.                        After reviewing the risks and benefits, the patient was                        deemed in satisfactory condition to undergo the                        procedure.                       After obtaining informed consent, the colonoscope was                        passed under direct vision. Throughout the procedure,  the patient's blood pressure, pulse, and oxygen                        saturations were monitored continuously. The [place] [id number]) was introduced through  the                        anus and advanced to the the cecum, identified by                        appendiceal orifice and ileocecal valve. The                        colonoscopy was performed without difficulty. The                        patient tolerated the procedure well. The quality of                        the bowel preparation was excellent. Findings:      The perianal and digital rectal examinations were normal.      Multiple small-mouthed diverticula were found in the sigmoid colon.      A 5 mm polyp was found in the sigmoid colon. The polyp was sessile. The       polyp was removed with a cold snare. Resection and retrieval were       complete. Impression:           - Diverticulosis in the sigmoid colon.                       - One 5 mm polyp in the sigmoid colon, removed with a                        cold snare. Resected and retrieved. Recommendation:       - Discharge patient to home.                       - Resume previous diet.                       - Continue present medications.                       - Await pathology results.                       - Repeat colonoscopy in 5 years for surveillance. Procedure Code(s):    --- Professional ---                       [phone], Colonoscopy, flexible; with removal of tumor(s),                        polyp(s), or other lesion(s) by snare technique Diagnosis Code(s):    --- Professional ---                       Z86.010, Personal history of colonic polyps  D12.5, Benign neoplasm of sigmoid colon CPT copyright 2016 American Medical Association. All rights reserved. The codes documented in this report are preliminary and upon coder review may  be revised to meet current compliance requirements. Lucilla Lame MD, MD 07/16/2016 11:26:13 AM This report has been signed electronically. Number of Addenda: 0 Note Initiated On: 07/16/2016 11:00 AM Scope Withdrawal Time: 0 hours 8 minutes 36 seconds  Total Procedure  Duration: 0 hours 11 minutes 4 seconds       [facility]

## 2016-07-17 ENCOUNTER — Encounter: Payer: Self-pay | Admitting: Gastroenterology

## 2016-07-20 ENCOUNTER — Encounter: Payer: Self-pay | Admitting: Gastroenterology

## 2016-07-23 ENCOUNTER — Encounter: Payer: Self-pay | Admitting: Gastroenterology

## 2017-12-02 ENCOUNTER — Encounter: Payer: Self-pay | Admitting: Gastroenterology

## 2017-12-02 ENCOUNTER — Ambulatory Visit: Payer: PRIVATE HEALTH INSURANCE | Admitting: Gastroenterology

## 2017-12-02 VITALS — BP 109/72 | HR 75 | Ht 66.0 in | Wt 176.4 lb

## 2017-12-02 DIAGNOSIS — R140 Abdominal distension (gaseous): Secondary | ICD-10-CM | POA: Diagnosis not present

## 2017-12-02 DIAGNOSIS — R142 Eructation: Secondary | ICD-10-CM | POA: Diagnosis not present

## 2017-12-02 DIAGNOSIS — K580 Irritable bowel syndrome with diarrhea: Secondary | ICD-10-CM

## 2017-12-02 NOTE — Progress Notes (Signed)
Susan Bellows MD, MRCP(U.K) [address]  [place], [place] [address]  Main: [phone]  Fax: [phone]   Primary Care Physician: Dion Body, MD  Primary Gastroenterologist:  Dr. Jonathon Strong   No chief complaint on file.   HPI: Susan Strong is a 57 y.o. female   Summary of history :  She was initially seen back in 05/2016 for diverticulitis and pancreatitis.   She was seen at the ER on 05/02/16 and per ED note has  ahistory of necrotizing pancreatitis in 01/2013  at [place] of unknown etiology   , gastroparesis , she presented to the ER with abdominal pain . She had a CT scan in the ER which showed pericolonic inflammaiton , acute diverticulitis , heptic hypodensities which could be cysts or hemangiomata. Cystic hypodensities ?pseudocysts , possible side branch IPMN. Subcenter hypodensities of the kidney. No gall bladder calculus. Recent labs including CMP,CBC,Lipase were normal.    She has brought in records from [place] and a MRI -severe acute pancreatitis (necrotizing pancreatitis fat necrosis) , no gall stones or common bile duct stones. No mention of pancreatic divisum in report. USG abdomen - normal . She was in the ICU for 18 days, resolved on its own . She was told she had a pseudocyst . I reviewed her labs during the admission , right after her pancreatitis her transaminases were normal , after she started TPN they were a bit elevated. An ultrasound at that time suggests she had some sludge . IGG4 levels were normal.  At her initial visit she had mentioned about some abdominal pain which was improving,had been treated for diverticulitis twice.   Family history of colon polyps.    Interval history   06/09/2016-  12/02/2017  06/24/16 MRCP  : small stable peripancreatic fluid collection consistent with a small pseudocyst. Benign hepatic and renal cysts.   06/2016 : colonoscopy : hyperplastic polyp, sigmoid diverticulosis.    She says that she has  gastroparesis - says when she eats a "full meal", gained some weight , says she has a large hiatal hernia diagnosed by her GI in the past. She says that a month back threw up . It scared her . She says she has been feeling more fatigued recently  . Lot of burping , lots of bloating no gas. She has a bowel movement upto 5 times a day , when she has a lot of pain can have very foul smelling diarrhea .Does have abdominal distension. No artificial sugars in her diet  No diet sodas. No sweet tea.   Feels at times her belching is very foul smelling .    No current outpatient medications on file.   No current facility-administered medications for this visit.     Allergies as of 12/02/2017  . (No Known Allergies)    ROS:  General: Negative for anorexia, weight loss, fever, chills, fatigue, weakness. ENT: Negative for hoarseness, difficulty swallowing , nasal congestion. CV: Negative for chest pain, angina, palpitations, dyspnea on exertion, peripheral edema.  Respiratory: Negative for dyspnea at rest, dyspnea on exertion, cough, sputum, wheezing.  GI: See history of present illness. GU:  Negative for dysuria, hematuria, urinary incontinence, urinary frequency, nocturnal urination.  Endo: Negative for unusual weight change.    Physical Examination:   There were no vitals taken for this visit.  General: Well-nourished, well-developed in no acute distress.  Eyes: No icterus. Conjunctivae pink. Mouth: Oropharyngeal mucosa moist and pink , no lesions erythema or  exudate. Lungs: Clear to auscultation bilaterally. Non-labored. Heart: Regular rate and rhythm, no murmurs rubs or gallops.  Abdomen: Bowel sounds are normal, nontender, nondistended, no hepatosplenomegaly or masses, no abdominal bruits or hernia , no rebound or guarding.   Extremities: No lower extremity edema. No clubbing or deformities. Neuro: Alert and oriented x 3.  Grossly intact. Skin: Warm and dry, no jaundice.   Psych: Alert  and cooperative, normal mood and affect.   Imaging Studies: No results found.  Assessment and Plan:   [name] is a 57 y.o. y/o female symptoms of diarrhea, as , bloating and increased freq of bowel movements. 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 She recollects having a alarge hiatal hernia.   Plan  1. Trial of CREON samples provided 2. Gastroparesis diet counseled and provided information  3. Will try above for 2 weeks if no better she will call me and will schedule EGD to r/o gastric outlet obstruction and if negative course of Xifaxan for IBS-D 4. Check CBC,TSH,CMP,celiac serology     Dr Susan Bellows  MD,MRCP [facility]) Follow up in 6 weeks

## 2017-12-03 LAB — COMPREHENSIVE METABOLIC PANEL
ALBUMIN: 4.4 g/dL (ref 3.5–5.5)
ALT: 21 IU/L (ref 0–32)
AST: 20 IU/L (ref 0–40)
Albumin/Globulin Ratio: 1.5 (ref 1.2–2.2)
Alkaline Phosphatase: 99 IU/L (ref 39–117)
BILIRUBIN TOTAL: 0.2 mg/dL (ref 0.0–1.2)
BUN / CREAT RATIO: 24 — AB (ref 9–23)
BUN: 19 mg/dL (ref 6–24)
CHLORIDE: 102 mmol/L (ref 96–106)
CO2: 22 mmol/L (ref 20–29)
Calcium: 9.6 mg/dL (ref 8.7–10.2)
Creatinine, Ser: 0.78 mg/dL (ref 0.57–1.00)
GFR calc Af Amer: 97 mL/min/{1.73_m2} (ref >59)
GFR calc non Af Amer: 84 mL/min/{1.73_m2} (ref >59)
GLOBULIN, TOTAL: 2.9 g/dL (ref 1.5–4.5)
GLUCOSE: 81 mg/dL (ref 65–99)
Potassium: 4.4 mmol/L (ref 3.5–5.2)
Sodium: 142 mmol/L (ref 134–144)
Total Protein: 7.3 g/dL (ref 6.0–8.5)

## 2017-12-03 LAB — CBC WITH DIFFERENTIAL/PLATELET
Basophils Absolute: 0 10*3/uL (ref 0.0–0.2)
Basos: 1 %
EOS (ABSOLUTE): 0.1 10*3/uL (ref 0.0–0.4)
Eos: 2 %
HEMOGLOBIN: 15.2 g/dL (ref 11.1–15.9)
Hematocrit: 44.5 % (ref 34.0–46.6)
Immature Grans (Abs): 0 10*3/uL (ref 0.0–0.1)
Immature Granulocytes: 0 %
LYMPHS ABS: 2.5 10*3/uL (ref 0.7–3.1)
Lymphs: 44 %
MCH: 28.7 pg (ref 26.6–33.0)
MCHC: 34.2 g/dL (ref 31.5–35.7)
MCV: 84 fL (ref 79–97)
MONOS ABS: 0.4 10*3/uL (ref 0.1–0.9)
Monocytes: 8 %
NEUTROS ABS: 2.5 10*3/uL (ref 1.4–7.0)
Neutrophils: 45 %
Platelets: 322 10*3/uL (ref 150–450)
RBC: 5.3 x10E6/uL — ABNORMAL HIGH (ref 3.77–5.28)
RDW: 12.4 % (ref 12.3–15.4)
WBC: 5.6 10*3/uL (ref 3.4–10.8)

## 2017-12-03 LAB — CELIAC DISEASE AB SCREEN W/RFX
ANTIGLIADIN ABS, IGA: 4 U (ref 0–19)
IGA/IMMUNOGLOBULIN A, SERUM: 320 mg/dL (ref 87–352)

## 2017-12-03 LAB — TSH: TSH: 1.26 u[IU]/mL (ref 0.450–4.500)

## 2018-01-10 ENCOUNTER — Ambulatory Visit: Payer: PRIVATE HEALTH INSURANCE | Admitting: Gastroenterology

## 2019-05-23 IMAGING — MR MR ABDOMEN WO/W CM MRCP
13 of 20 series · 28 of 48 positions shown · IV contrast (15mL MULTIHANCE)
Comparison: CT on 05/02/2016

CLINICAL DATA: Followup cystic pancreatic lesions. Intermittent
abdominal pain. Previous pancreatitis.

EXAM:
MRI ABDOMEN WITHOUT AND WITH CONTRAST (INCLUDING MRCP)
TECHNIQUE: Multiplanar multisequence MR imaging of the abdomen was performed
both before and after the administration of intravenous contrast.
Heavily T2-weighted images of the biliary and pancreatic ducts were
obtained, and three-dimensional MRCP images were rendered by post
processing.
CONTRAST:  15mL MULTIHANCE GADOBENATE DIMEGLUMINE 529 MG/ML IV SOLN

[Series 3: T2 · coronal · 8.0mm · 0.78mm/px · 2 of 21 slices shown (1 of 2)]
[im 1/21]
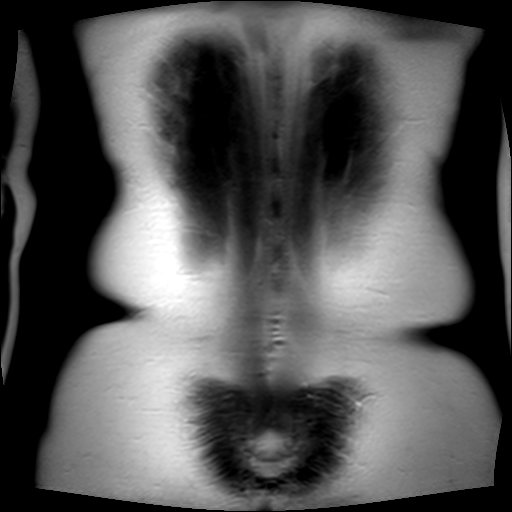
[im 21/21]
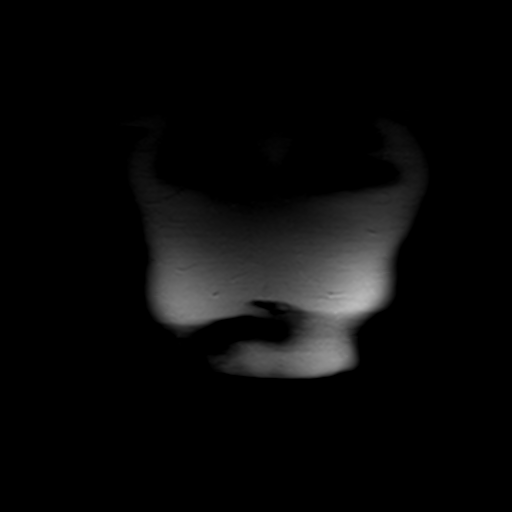

[Series 4: T2 fat-sat · axial · 7.0mm · 0.74mm/px · z∈[-45,+182]mm · 2 of 28 slices shown]
[im 1/28]
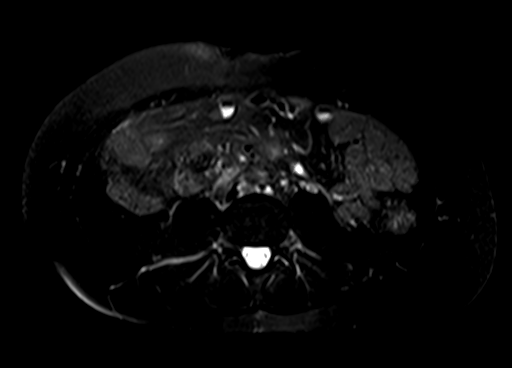
[im 28/28]
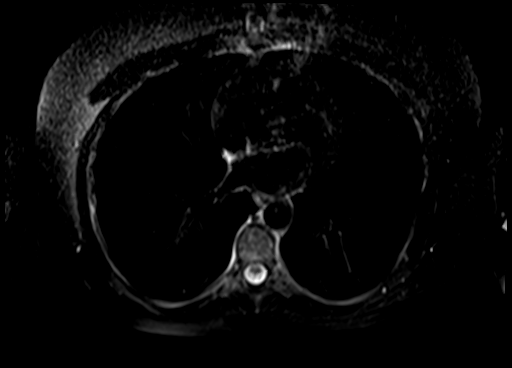

[Series 11: cor thins · coronal · 4.0mm · 0.89mm/px · 2 of 11 slices shown]
[im 1/11]
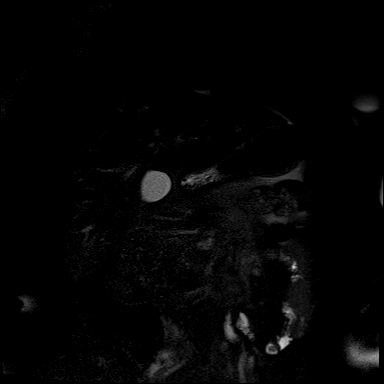
[im 11/11]
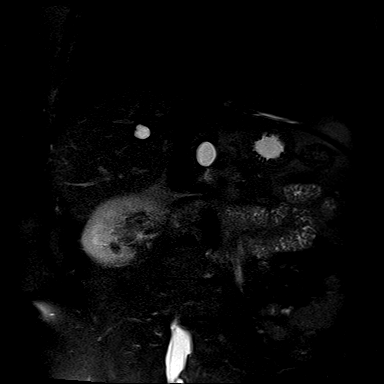

[Series 12: T2 · axial · 7.0mm · 0.74mm/px · z∈[-38,+180]mm · 2 of 27 slices shown (2 of 2)]
[im 1/27]
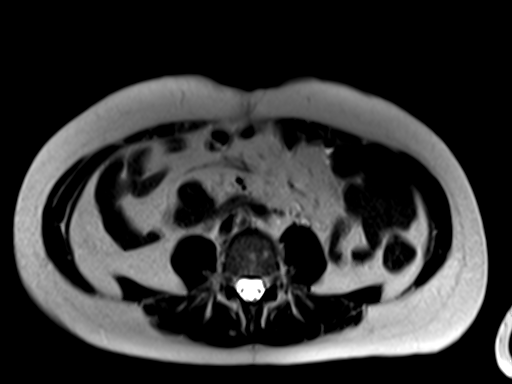
[im 27/27]
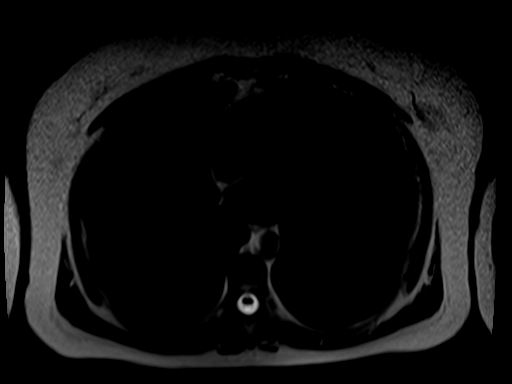

[Series 13: ax dual echo · axial · 7.0mm · 0.74mm/px · z∈[-33,+177]mm · 2 of 52 slices shown]
[im 1/52]
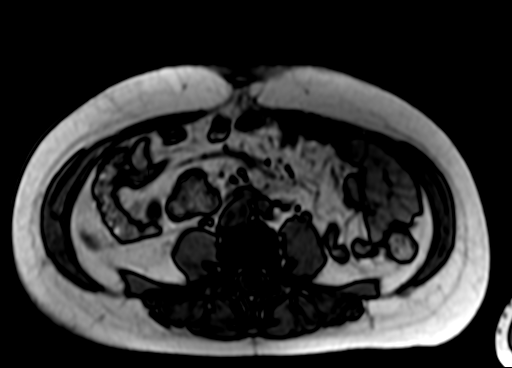
[im 52/52]
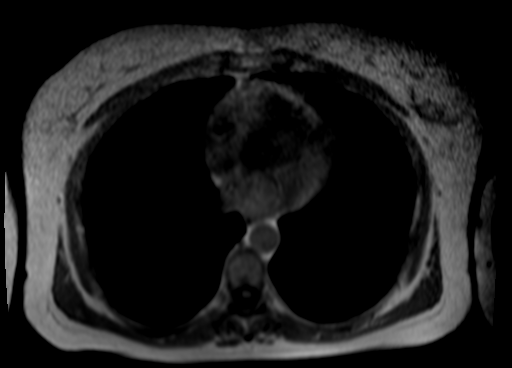

[Series 14: DWI · axial · 6.0mm · 1.77mm/px · z∈[-41,+182]mm · 3 of 96 slices shown]
[im 1/96]
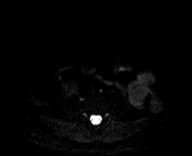
[im 48/96]
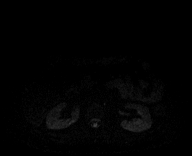
[im 96/96]
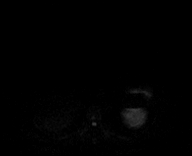

[Series 15: ax dwi_adc · axial · 6.0mm · 1.77mm/px · 1 of 32 slices shown]
[im 1/32]
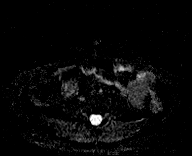

[Series 16: cor tru fisp · coronal · 4.0mm · 0.74mm/px · 1 of 40 slices shown]
[im 1/40]
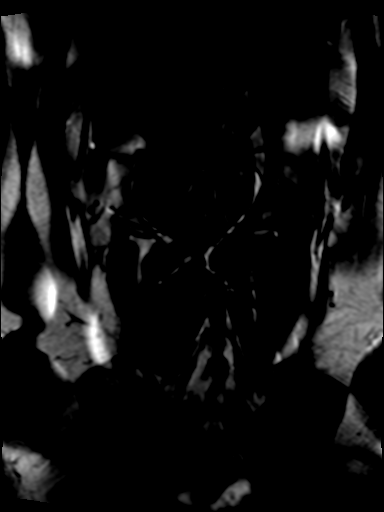

[Series 17: T1 dynamic fat-sat · axial · non-contrast · 2.5mm · 0.74mm/px · z∈[-48,+190]mm · 3 of 96 slices shown (1 of 4)]
[im 1/96]
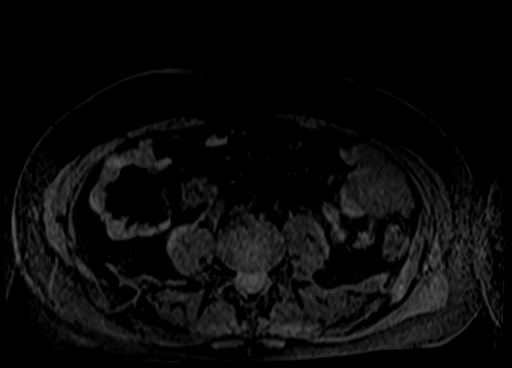
[im 48/96]
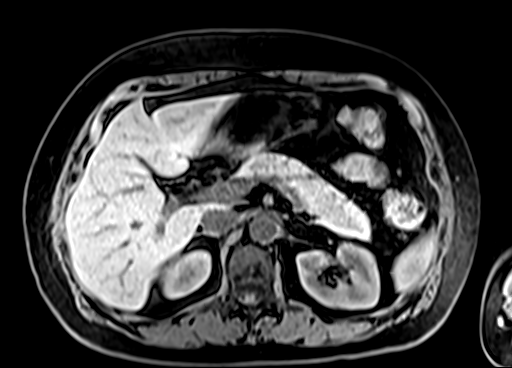
[im 96/96]
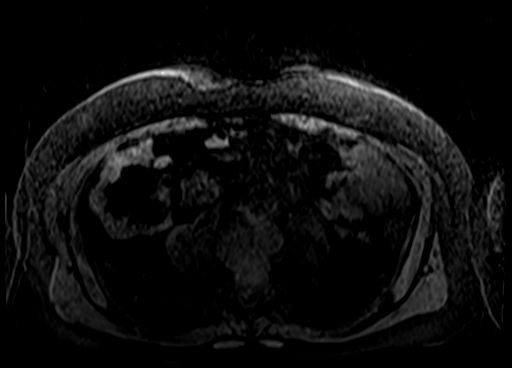

[Series 18: T1 dynamic fat-sat · axial · 2.5mm · 0.74mm/px · z∈[-48,+190]mm · 3 of 96 slices shown (2 of 4)]
[im 1/96]
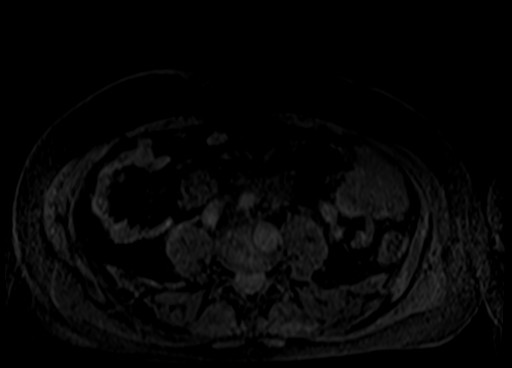
[im 48/96]
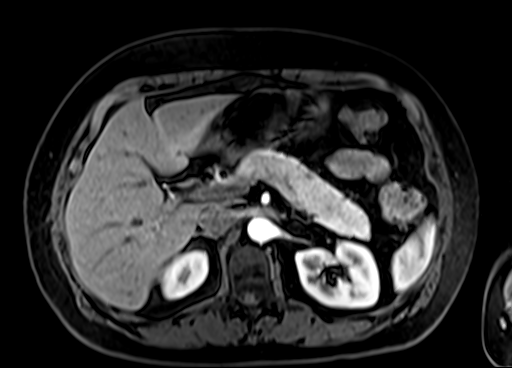
[im 96/96]
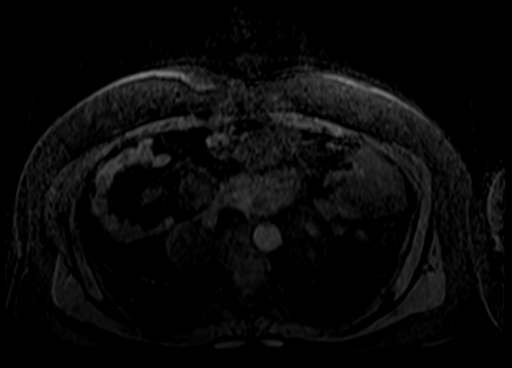

[Series 19: T1 dynamic fat-sat · axial · 2.5mm · 0.74mm/px · z∈[-48,+190]mm · 3 of 93 slices shown (3 of 4)]
[im 1/93]
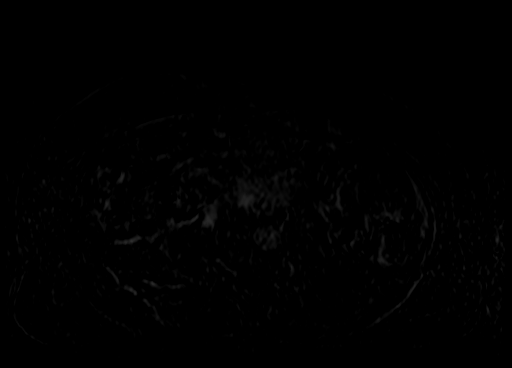
[im 47/93]
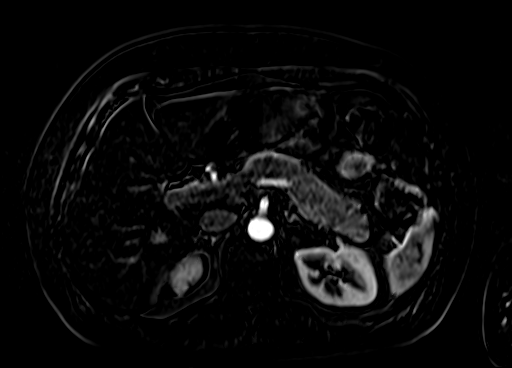
[im 93/93]
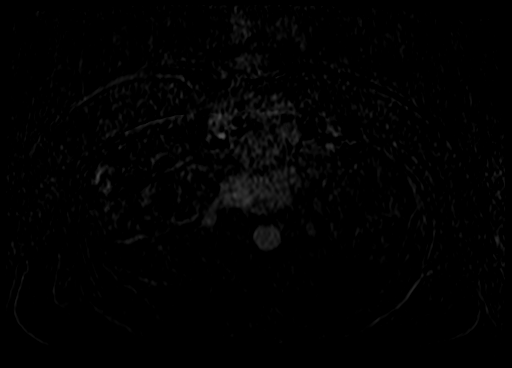

[Series 20: T1 dynamic fat-sat post-contrast · axial · 2.5mm · 0.74mm/px · z∈[-48,+190]mm · 3 of 96 slices shown]
[im 1/96]
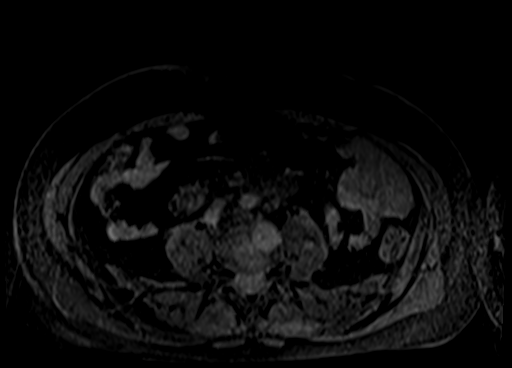
[im 48/96]
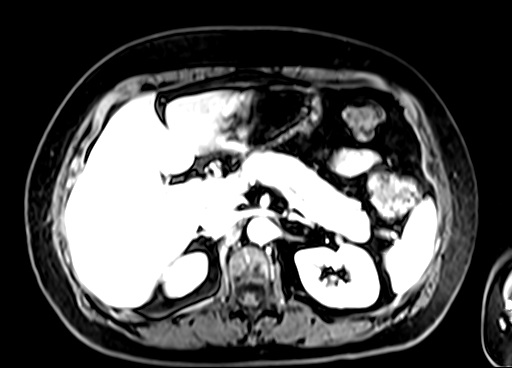
[im 96/96]
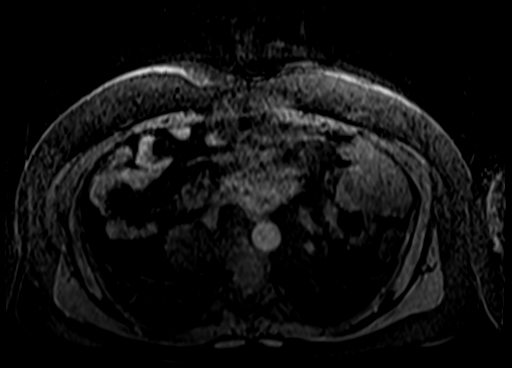

[Series 21: T1 dynamic fat-sat · axial · 2.5mm · 0.74mm/px · 1 of 96 slices shown (4 of 4)]
[im 1/96]
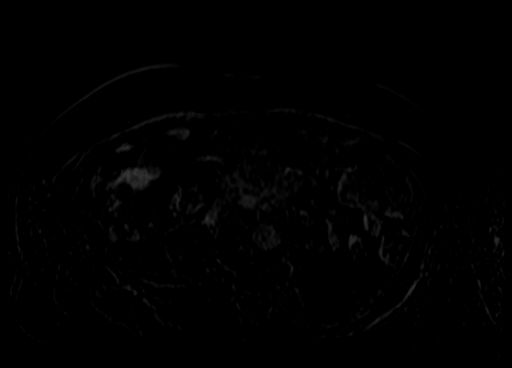

[28 of 48 positions shown; findings below may reference images not displayed]

FINDINGS: Lower chest: No acute findings.

Hepatobiliary: No masses identified. Multiple small benign-appearing
cysts are again seen. Gallbladder is unremarkable. No evidence of
biliary ductal dilatation.

Pancreas: No evidence of pancreatic mass or ductal dilatation. No
evidence of peripancreatic inflammatory changes. A small rim
enhancing fluid collection is seen along the posterior -superior
margin of the pancreatic body, which measures 1.3 x 2.2 cm on image
43/25. This remains stable compared to previous study, consistent
with a small pancreatic pseudocyst. Other previously seen small
cystic areas along the pancreatic tail are no longer visualized,
consistent with resolving pseudocysts.

Spleen:  Within normal limits in size and appearance.

Adrenals/Urinary Tract: No masses identified. Tiny hemorrhagic cyst
seen in midpole of right kidney. No evidence of hydronephrosis.

Stomach/Bowel: Visualized portions within the abdomen are
unremarkable.

Vascular/Lymphatic: No pathologically enlarged lymph nodes
identified. No abdominal aortic aneurysm.

Other:  None.

Musculoskeletal:  No suspicious bone lesions identified.
IMPRESSION: Stable small peripancreatic fluid collection adjacent to pancreatic
body, consistent with small pseudocyst. Previously seen cystic areas
along the pancreatic tail have resolved.

No radiographic evidence of pancreatitis or other acute findings.

Small benign-appearing hepatic and renal cysts.

## 2021-05-30 DIAGNOSIS — G8929 Other chronic pain: Secondary | ICD-10-CM | POA: Diagnosis not present

## 2021-05-30 DIAGNOSIS — S42031D Displaced fracture of lateral end of right clavicle, subsequent encounter for fracture with routine healing: Secondary | ICD-10-CM | POA: Diagnosis not present

## 2021-05-30 DIAGNOSIS — M898X1 Other specified disorders of bone, shoulder: Secondary | ICD-10-CM | POA: Diagnosis not present

## 2021-05-30 DIAGNOSIS — M25511 Pain in right shoulder: Secondary | ICD-10-CM | POA: Diagnosis not present

## 2021-05-30 DIAGNOSIS — M7581 Other shoulder lesions, right shoulder: Secondary | ICD-10-CM | POA: Diagnosis not present

## 2021-12-15 DIAGNOSIS — R030 Elevated blood-pressure reading, without diagnosis of hypertension: Secondary | ICD-10-CM | POA: Diagnosis not present

## 2021-12-15 DIAGNOSIS — M1990 Unspecified osteoarthritis, unspecified site: Secondary | ICD-10-CM | POA: Diagnosis not present

## 2021-12-15 DIAGNOSIS — Z791 Long term (current) use of non-steroidal anti-inflammatories (NSAID): Secondary | ICD-10-CM | POA: Diagnosis not present

## 2022-07-27 DIAGNOSIS — M170 Bilateral primary osteoarthritis of knee: Secondary | ICD-10-CM | POA: Diagnosis not present

## 2022-07-27 DIAGNOSIS — M25562 Pain in left knee: Secondary | ICD-10-CM | POA: Diagnosis not present

## 2022-07-27 DIAGNOSIS — M25561 Pain in right knee: Secondary | ICD-10-CM | POA: Diagnosis not present

## 2022-08-18 ENCOUNTER — Other Ambulatory Visit: Payer: Self-pay | Admitting: Family Medicine

## 2022-08-18 ENCOUNTER — Ambulatory Visit
Admission: RE | Admit: 2022-08-18 | Discharge: 2022-08-18 | Disposition: A | Discharge status: Home / Self Care | Payer: 59 | Source: Ambulatory Visit | Attending: Family Medicine | Admitting: Family Medicine

## 2022-08-18 DIAGNOSIS — Z1231 Encounter for screening mammogram for malignant neoplasm of breast: Secondary | ICD-10-CM | POA: Insufficient documentation

## 2022-08-20 ENCOUNTER — Other Ambulatory Visit: Payer: Self-pay | Admitting: *Deleted

## 2022-08-20 ENCOUNTER — Inpatient Hospital Stay
Admission: RE | Admit: 2022-08-20 | Discharge: 2022-08-20 | Disposition: A | Discharge status: Home / Self Care | Payer: Self-pay | Source: Ambulatory Visit | Attending: Family Medicine | Admitting: Family Medicine

## 2022-08-20 DIAGNOSIS — Z1231 Encounter for screening mammogram for malignant neoplasm of breast: Secondary | ICD-10-CM

## 2022-09-21 DIAGNOSIS — L820 Inflamed seborrheic keratosis: Secondary | ICD-10-CM | POA: Diagnosis not present

## 2022-09-21 DIAGNOSIS — L570 Actinic keratosis: Secondary | ICD-10-CM | POA: Diagnosis not present

## 2022-09-21 DIAGNOSIS — L821 Other seborrheic keratosis: Secondary | ICD-10-CM | POA: Diagnosis not present

## 2022-09-21 DIAGNOSIS — R208 Other disturbances of skin sensation: Secondary | ICD-10-CM | POA: Diagnosis not present

## 2022-09-21 DIAGNOSIS — L988 Other specified disorders of the skin and subcutaneous tissue: Secondary | ICD-10-CM | POA: Diagnosis not present

## 2022-09-21 DIAGNOSIS — L298 Other pruritus: Secondary | ICD-10-CM | POA: Diagnosis not present

## 2022-09-21 DIAGNOSIS — D23112 Other benign neoplasm of skin of right lower eyelid, including canthus: Secondary | ICD-10-CM | POA: Diagnosis not present

## 2022-11-28 DIAGNOSIS — Z8249 Family history of ischemic heart disease and other diseases of the circulatory system: Secondary | ICD-10-CM | POA: Diagnosis not present

## 2022-11-28 DIAGNOSIS — Z818 Family history of other mental and behavioral disorders: Secondary | ICD-10-CM | POA: Diagnosis not present

## 2022-11-28 DIAGNOSIS — M1990 Unspecified osteoarthritis, unspecified site: Secondary | ICD-10-CM | POA: Diagnosis not present

## 2022-11-28 DIAGNOSIS — Z809 Family history of malignant neoplasm, unspecified: Secondary | ICD-10-CM | POA: Diagnosis not present

## 2023-08-24 DIAGNOSIS — Z8249 Family history of ischemic heart disease and other diseases of the circulatory system: Secondary | ICD-10-CM | POA: Diagnosis not present

## 2023-08-24 DIAGNOSIS — M1990 Unspecified osteoarthritis, unspecified site: Secondary | ICD-10-CM | POA: Diagnosis not present

## 2023-08-24 DIAGNOSIS — Z818 Family history of other mental and behavioral disorders: Secondary | ICD-10-CM | POA: Diagnosis not present

## 2023-08-24 DIAGNOSIS — Z791 Long term (current) use of non-steroidal anti-inflammatories (NSAID): Secondary | ICD-10-CM | POA: Diagnosis not present

## 2023-08-24 DIAGNOSIS — Z809 Family history of malignant neoplasm, unspecified: Secondary | ICD-10-CM | POA: Diagnosis not present

## 2023-09-28 DIAGNOSIS — D2271 Melanocytic nevi of right lower limb, including hip: Secondary | ICD-10-CM | POA: Diagnosis not present

## 2023-09-28 DIAGNOSIS — L570 Actinic keratosis: Secondary | ICD-10-CM | POA: Diagnosis not present

## 2023-09-28 DIAGNOSIS — D2272 Melanocytic nevi of left lower limb, including hip: Secondary | ICD-10-CM | POA: Diagnosis not present

## 2023-09-28 DIAGNOSIS — D225 Melanocytic nevi of trunk: Secondary | ICD-10-CM | POA: Diagnosis not present

## 2023-09-28 DIAGNOSIS — L821 Other seborrheic keratosis: Secondary | ICD-10-CM | POA: Diagnosis not present

## 2023-09-28 DIAGNOSIS — D2262 Melanocytic nevi of left upper limb, including shoulder: Secondary | ICD-10-CM | POA: Diagnosis not present

## 2023-09-28 DIAGNOSIS — D2261 Melanocytic nevi of right upper limb, including shoulder: Secondary | ICD-10-CM | POA: Diagnosis not present

## 2023-10-18 ENCOUNTER — Other Ambulatory Visit: Payer: Self-pay | Admitting: Family Medicine

## 2023-10-18 DIAGNOSIS — Z1231 Encounter for screening mammogram for malignant neoplasm of breast: Secondary | ICD-10-CM

## 2023-11-08 ENCOUNTER — Ambulatory Visit
Admission: RE | Admit: 2023-11-08 | Discharge: 2023-11-08 | Disposition: A | Discharge status: Home / Self Care | Source: Ambulatory Visit | Attending: Family Medicine | Admitting: Family Medicine

## 2023-11-08 DIAGNOSIS — Z1231 Encounter for screening mammogram for malignant neoplasm of breast: Secondary | ICD-10-CM | POA: Insufficient documentation
